# Patient Record
Sex: Female | Born: 1965 | Race: Asian | Hispanic: No | Marital: Married | Smoking: Never smoker
Health system: Southern US, Community
[De-identification: ages and names within clinical notes are randomized; demographics above are authoritative.]

## PROBLEM LIST (undated history)

## (undated) HISTORY — PX: BREAST ENHANCEMENT SURGERY: SHX7

---

## 2016-10-12 ENCOUNTER — Ambulatory Visit: Payer: Self-pay | Admitting: Nurse Practitioner

## 2016-10-16 ENCOUNTER — Ambulatory Visit (INDEPENDENT_AMBULATORY_CARE_PROVIDER_SITE_OTHER): Payer: BLUE CROSS/BLUE SHIELD | Admitting: Nurse Practitioner

## 2016-10-16 ENCOUNTER — Other Ambulatory Visit (INDEPENDENT_AMBULATORY_CARE_PROVIDER_SITE_OTHER): Payer: BLUE CROSS/BLUE SHIELD

## 2016-10-16 ENCOUNTER — Encounter: Payer: Self-pay | Admitting: Nurse Practitioner

## 2016-10-16 VITALS — BP 120/90 | HR 78 | Temp 98.3°F | Ht 61.0 in | Wt 116.0 lb

## 2016-10-16 DIAGNOSIS — Z23 Encounter for immunization: Secondary | ICD-10-CM

## 2016-10-16 DIAGNOSIS — Z780 Asymptomatic menopausal state: Secondary | ICD-10-CM | POA: Diagnosis not present

## 2016-10-16 DIAGNOSIS — Z136 Encounter for screening for cardiovascular disorders: Secondary | ICD-10-CM

## 2016-10-16 DIAGNOSIS — L299 Pruritus, unspecified: Secondary | ICD-10-CM | POA: Diagnosis not present

## 2016-10-16 DIAGNOSIS — Z114 Encounter for screening for human immunodeficiency virus [HIV]: Secondary | ICD-10-CM

## 2016-10-16 DIAGNOSIS — Z0000 Encounter for general adult medical examination without abnormal findings: Secondary | ICD-10-CM | POA: Diagnosis not present

## 2016-10-16 DIAGNOSIS — Z0001 Encounter for general adult medical examination with abnormal findings: Secondary | ICD-10-CM

## 2016-10-16 DIAGNOSIS — Z13220 Encounter for screening for lipoid disorders: Secondary | ICD-10-CM

## 2016-10-16 LAB — CBC
HCT: 41.2 % (ref 36.0–46.0)
HEMOGLOBIN: 13.7 g/dL (ref 12.0–15.0)
MCHC: 33.2 g/dL (ref 30.0–36.0)
MCV: 86.4 fl (ref 78.0–100.0)
Platelets: 309 10*3/uL (ref 150.0–400.0)
RBC: 4.77 Mil/uL (ref 3.87–5.11)
RDW: 12.9 % (ref 11.5–15.5)
WBC: 6.8 10*3/uL (ref 4.0–10.5)

## 2016-10-16 LAB — COMPREHENSIVE METABOLIC PANEL
ALK PHOS: 67 U/L (ref 39–117)
ALT: 17 U/L (ref 0–35)
AST: 17 U/L (ref 0–37)
Albumin: 4.5 g/dL (ref 3.5–5.2)
BUN: 14 mg/dL (ref 6–23)
CO2: 26 mEq/L (ref 19–32)
Calcium: 9.4 mg/dL (ref 8.4–10.5)
Chloride: 105 mEq/L (ref 96–112)
Creatinine, Ser: 0.65 mg/dL (ref 0.40–1.20)
GFR: 103.16 mL/min (ref >60.00)
GLUCOSE: 101 mg/dL — AB (ref 70–99)
Potassium: 4.2 mEq/L (ref 3.5–5.1)
SODIUM: 140 meq/L (ref 135–145)
TOTAL PROTEIN: 8 g/dL (ref 6.0–8.3)
Total Bilirubin: 0.5 mg/dL (ref 0.2–1.2)

## 2016-10-16 LAB — LIPID PANEL
Cholesterol: 161 mg/dL (ref 0–200)
HDL: 36.3 mg/dL — AB (ref >39.00)
NonHDL: 125.07
Total CHOL/HDL Ratio: 4
Triglycerides: 250 mg/dL — ABNORMAL HIGH (ref 0.0–149.0)
VLDL: 50 mg/dL — ABNORMAL HIGH (ref 0.0–40.0)

## 2016-10-16 LAB — TSH: TSH: 0.84 u[IU]/mL (ref 0.35–4.50)

## 2016-10-16 LAB — LDL CHOLESTEROL, DIRECT: Direct LDL: 88 mg/dL

## 2016-10-16 NOTE — Patient Instructions (Signed)
Go to basement for blood draw. You will be called with results.  Please make appointment for bone density at front desk

## 2016-10-16 NOTE — Progress Notes (Signed)
Subjective:    Patient ID: Amy Duffy, female    DOB: [birth year], 51 y.o.   MRN: [id number]  Patient presents today for complete physical (new patient)  HPI  use of video interpreter:  Itching of hands(chronic): Onset 1year ago, intermittent, itching at night. No rash.  She want dexa scan done despite young age.  Immunizations: (TDAP, Hep C screen, Pneumovax, Influenza, zoster)  Health Maintenance  Topic Date Due  . HIV Screening  03/31/1983  . Tetanus Vaccine  03/31/1987  . Pap Smear  03/30/1989  . Flu Shot  12/19/2016   Diet:regular  Weight:  Wt Readings from Last 3 Encounters:  10/16/16 116 lb (52.6 kg)   Exercise:none.  Fall Risk: Fall Risk  10/16/2016  Falls in the past year? No   Home Safety:home with adult son, works at [employer].  Depression/Suicide: [facility] 10/16/2016  Decreased Interest 0  Down, Depressed, Hopeless 0  PHQ - 2 Score 0   Pap Smear (every 4672yrs for >21-29 without HPV, every 7645yrs for >30-71645yrs with HPV):up to date per patient, done 03/2016 while in [place].  Vision:needed.  Dental:needed.  Sexual History (birth control, marital status, STD):married not sexually active at this time  Medications and allergies reviewed with patient and updated if appropriate.  There are no active problems to display for this patient.   No current outpatient prescriptions on file prior to visit.   No current facility-administered medications on file prior to visit.     History reviewed. No pertinent past medical history.  Past Surgical History:  Procedure Laterality Date  . BREAST ENHANCEMENT SURGERY Bilateral    breast implant    Social History   Social History  . Marital status: Married    Spouse name: N/A  . Number of children: N/A  . Years of education: N/A   Social History Main Topics  . Smoking status: Never Smoker  . Smokeless tobacco: Never Used  . Alcohol use No  . Drug use: No  . Sexual activity: Not Currently    Other Topics Concern  . None   Social History Narrative  . None    Family History  Problem Relation Age of Onset  . Family history unknown: Yes        Review of Systems  Constitutional: Negative for fever, malaise/fatigue and weight loss.  HENT: Negative for congestion and sore throat.   Eyes:       Negative for visual changes  Respiratory: Negative for cough and shortness of breath.   Cardiovascular: Negative for chest pain, palpitations and leg swelling.  Gastrointestinal: Negative for blood in stool, constipation, diarrhea and heartburn.  Genitourinary: Negative for dysuria, frequency and urgency.  Musculoskeletal: Negative for falls, joint pain and myalgias.  Skin: Positive for itching. Negative for rash.  Neurological: Negative for dizziness, sensory change and headaches.  Endo/Heme/Allergies: Does not bruise/bleed easily.  Psychiatric/Behavioral: Negative for depression, substance abuse and suicidal ideas. The patient is not nervous/anxious.     Objective:   Vitals:   10/16/16 0918  BP: 120/90  Pulse: 78  Temp: 98.3 F (36.8 C)    Body mass index is 21.92 kg/m.   Physical Examination:  Physical Exam  Constitutional: She is oriented to person, place, and time and well-developed, well-nourished, and in no distress. No distress.  HENT:  Right Ear: External ear normal.  Left Ear: External ear normal.  Nose: Nose normal.  Mouth/Throat: Oropharynx is clear and moist. No oropharyngeal exudate.  Eyes: Conjunctivae  and EOM are normal. Pupils are equal, round, and reactive to light. No scleral icterus.  Neck: Normal range of motion. Neck supple. No thyromegaly present.  Cardiovascular: Normal rate, normal heart sounds and intact distal pulses.   Pulmonary/Chest: Effort normal and breath sounds normal. She exhibits no tenderness.  Declined breast exam.  Abdominal: Soft. Bowel sounds are normal. She exhibits no distension. There is no tenderness.    Genitourinary:  Genitourinary Comments: Declined  Musculoskeletal: Normal range of motion. She exhibits no edema or tenderness.  Lymphadenopathy:    She has no cervical adenopathy.  Neurological: She is alert and oriented to person, place, and time. Gait normal.  Skin: Skin is warm and dry. No rash noted. No erythema.  Psychiatric: Affect and judgment normal.    ASSESSMENT and PLAN:  Folasade was seen today for establish care.  Diagnoses and all orders for this visit:  Preventative health care -     TSH; Future -     Comprehensive metabolic panel; Future -     CBC; Future -     Lipid panel; Future -     HIV antibody (with reflex); Future -     DG Bone Density; Future  Itching  Asymptomatic postmenopausal estrogen deficiency -     DG Bone Density; Future  Encounter for screening for human immunodeficiency virus (HIV) -     HIV antibody (with reflex); Future  Encounter for lipid screening for cardiovascular disease -     Lipid panel; Future  Need for diphtheria-tetanus-pertussis (Tdap) vaccine -     Tdap vaccine greater than or equal to 7yo IM   No problem-specific Assessment & Plan notes found for this encounter.     Follow up: Return if symptoms worsen or fail to improve.  Alysia Penna, NP

## 2016-10-17 LAB — HIV ANTIBODY (ROUTINE TESTING W REFLEX): HIV 1&2 Ab, 4th Generation: NONREACTIVE

## 2016-12-04 ENCOUNTER — Inpatient Hospital Stay: Admission: RE | Admit: 2016-12-04 | Payer: BLUE CROSS/BLUE SHIELD | Source: Ambulatory Visit

## 2017-03-21 ENCOUNTER — Encounter (HOSPITAL_COMMUNITY): Payer: Self-pay | Admitting: Emergency Medicine

## 2017-03-21 ENCOUNTER — Emergency Department (HOSPITAL_COMMUNITY): Payer: BLUE CROSS/BLUE SHIELD

## 2017-03-21 ENCOUNTER — Emergency Department (HOSPITAL_COMMUNITY)
Admission: EM | Admit: 2017-03-21 | Discharge: 2017-03-21 | Disposition: A | Discharge status: Home / Self Care | Payer: BLUE CROSS/BLUE SHIELD | Attending: Emergency Medicine | Admitting: Emergency Medicine

## 2017-03-21 DIAGNOSIS — Y92410 Unspecified street and highway as the place of occurrence of the external cause: Secondary | ICD-10-CM | POA: Diagnosis not present

## 2017-03-21 DIAGNOSIS — S299XXA Unspecified injury of thorax, initial encounter: Secondary | ICD-10-CM | POA: Diagnosis present

## 2017-03-21 DIAGNOSIS — Y999 Unspecified external cause status: Secondary | ICD-10-CM | POA: Insufficient documentation

## 2017-03-21 DIAGNOSIS — Y9389 Activity, other specified: Secondary | ICD-10-CM | POA: Insufficient documentation

## 2017-03-21 DIAGNOSIS — M25571 Pain in right ankle and joints of right foot: Secondary | ICD-10-CM | POA: Insufficient documentation

## 2017-03-21 DIAGNOSIS — S2241XA Multiple fractures of ribs, right side, initial encounter for closed fracture: Secondary | ICD-10-CM | POA: Diagnosis not present

## 2017-03-21 LAB — I-STAT CHEM 8, ED
BUN: 20 mg/dL (ref 6–20)
CREATININE: 0.4 mg/dL — AB (ref 0.44–1.00)
Calcium, Ion: 1.16 mmol/L (ref 1.15–1.40)
Chloride: 104 mmol/L (ref 101–111)
GLUCOSE: 111 mg/dL — AB (ref 65–99)
HCT: 40 % (ref 36.0–46.0)
HEMOGLOBIN: 13.6 g/dL (ref 12.0–15.0)
POTASSIUM: 4.1 mmol/L (ref 3.5–5.1)
Sodium: 140 mmol/L (ref 135–145)
TCO2: 27 mmol/L (ref 22–32)

## 2017-03-21 MED ORDER — IBUPROFEN 800 MG PO TABS: 800.0000 mg | ORAL_TABLET | Freq: Three times a day (TID) | ORAL | 0 refills | Status: AC

## 2017-03-21 MED ORDER — METHOCARBAMOL 500 MG PO TABS: 500.0000 mg | ORAL_TABLET | Freq: Every evening | ORAL | 0 refills | Status: AC | PRN

## 2017-03-21 MED ORDER — OXYCODONE-ACETAMINOPHEN 5-325 MG PO TABS: 2.0000 | ORAL_TABLET | ORAL | 0 refills | Status: DC | PRN

## 2017-03-21 MED ORDER — OXYCODONE-ACETAMINOPHEN 5-325 MG PO TABS
1.0000 | ORAL_TABLET | Freq: Once | ORAL | Status: AC
  Administered 2017-03-21: 1 via ORAL
  Filled 2017-03-21: qty 1

## 2017-03-21 MED ORDER — IOPAMIDOL (ISOVUE-300) INJECTION 61%
INTRAVENOUS | Status: AC
  Administered 2017-03-21: 75 mL
  Filled 2017-03-21: qty 75

## 2017-03-21 NOTE — ED Notes (Signed)
Pt transported to and from CT/radiology via wheelchair with tech; son accompanied pt to interpret.

## 2017-03-21 NOTE — ED Notes (Signed)
Pt triaged by using [employer]

## 2017-03-21 NOTE — ED Notes (Signed)
ED Provider at bedside. 
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
## 2017-03-21 NOTE — Progress Notes (Signed)
Orthopedic Tech Progress Note Patient Details:  Dyke MaesLan Melody 1965-06-14 [id number] Patient refused aso splint. Patient ID: Dyke MaesLan Glander, female   DOB: 1965-06-14, 51 y.o.   MRN: [id number]   Jennye MoccasinHughes, Kimmy Parish Craig 03/21/2017, 8:25 PM
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
## 2022-12-06 ENCOUNTER — Emergency Department (HOSPITAL_COMMUNITY): Payer: BLUE CROSS/BLUE SHIELD

## 2022-12-06 ENCOUNTER — Encounter (HOSPITAL_COMMUNITY): Payer: Self-pay

## 2022-12-06 ENCOUNTER — Emergency Department (HOSPITAL_COMMUNITY)
Admission: EM | Admit: 2022-12-06 | Discharge: 2022-12-06 | Disposition: A | Discharge status: Home / Self Care | Payer: BLUE CROSS/BLUE SHIELD | Attending: Emergency Medicine | Admitting: Emergency Medicine

## 2022-12-06 ENCOUNTER — Other Ambulatory Visit: Payer: Self-pay

## 2022-12-06 DIAGNOSIS — N201 Calculus of ureter: Secondary | ICD-10-CM | POA: Insufficient documentation

## 2022-12-06 DIAGNOSIS — R109 Unspecified abdominal pain: Secondary | ICD-10-CM | POA: Diagnosis present

## 2022-12-06 LAB — BASIC METABOLIC PANEL
Anion gap: 8 (ref 5–15)
BUN: 16 mg/dL (ref 6–20)
CO2: 24 mmol/L (ref 22–32)
Calcium: 9.1 mg/dL (ref 8.9–10.3)
Chloride: 105 mmol/L (ref 98–111)
Creatinine, Ser: 0.66 mg/dL (ref 0.44–1.00)
GFR, Estimated: >60 mL/min (ref >60)
Glucose, Bld: 131 mg/dL — ABNORMAL HIGH (ref 70–99)
Potassium: 4 mmol/L (ref 3.5–5.1)
Sodium: 137 mmol/L (ref 135–145)

## 2022-12-06 LAB — URINALYSIS, ROUTINE W REFLEX MICROSCOPIC
Bilirubin Urine: NEGATIVE
Glucose, UA: NEGATIVE mg/dL
Ketones, ur: NEGATIVE mg/dL
Leukocytes,Ua: NEGATIVE
Nitrite: NEGATIVE
Protein, ur: NEGATIVE mg/dL
RBC / HPF: >50 RBC/hpf (ref 0–5)
Specific Gravity, Urine: 1.016 (ref 1.005–1.030)
pH: 6 (ref 5.0–8.0)

## 2022-12-06 LAB — CBC
HCT: 41.4 % (ref 36.0–46.0)
Hemoglobin: 13.2 g/dL (ref 12.0–15.0)
MCH: 27.8 pg (ref 26.0–34.0)
MCHC: 31.9 g/dL (ref 30.0–36.0)
MCV: 87.2 fL (ref 80.0–100.0)
Platelets: 320 10*3/uL (ref 150–400)
RBC: 4.75 MIL/uL (ref 3.87–5.11)
RDW: 13.1 % (ref 11.5–15.5)
WBC: 8.4 10*3/uL (ref 4.0–10.5)
nRBC: 0 % (ref 0.0–0.2)

## 2022-12-06 LAB — LIPASE, BLOOD: Lipase: 41 U/L (ref 11–51)

## 2022-12-06 LAB — HEPATIC FUNCTION PANEL
ALT: 21 U/L (ref 0–44)
AST: 21 U/L (ref 15–41)
Albumin: 4 g/dL (ref 3.5–5.0)
Alkaline Phosphatase: 71 U/L (ref 38–126)
Bilirubin, Direct: <0.1 mg/dL (ref 0.0–0.2)
Total Bilirubin: 0.8 mg/dL (ref 0.3–1.2)
Total Protein: 7.5 g/dL (ref 6.5–8.1)

## 2022-12-06 MED ORDER — TAMSULOSIN HCL 0.4 MG PO CAPS: 0.4000 mg | ORAL_CAPSULE | Freq: Every day | ORAL | 0 refills | Status: AC

## 2022-12-06 MED ORDER — ACETAMINOPHEN 500 MG PO TABS
1000.0000 mg | ORAL_TABLET | Freq: Once | ORAL | Status: AC
  Administered 2022-12-06: 1000 mg via ORAL
  Filled 2022-12-06: qty 2

## 2022-12-06 MED ORDER — DIPHENHYDRAMINE HCL 25 MG PO CAPS
25.0000 mg | ORAL_CAPSULE | Freq: Once | ORAL | Status: DC
  Filled 2022-12-06: qty 1

## 2022-12-06 MED ORDER — ONDANSETRON 4 MG PO TBDP
4.0000 mg | ORAL_TABLET | Freq: Once | ORAL | Status: AC
  Administered 2022-12-06: 4 mg via ORAL
  Filled 2022-12-06: qty 1

## 2022-12-06 MED ORDER — OXYCODONE HCL 5 MG PO TABS
5.0000 mg | ORAL_TABLET | Freq: Once | ORAL | Status: AC
  Administered 2022-12-06: 5 mg via ORAL
  Filled 2022-12-06: qty 1

## 2022-12-06 MED ORDER — IOHEXOL 350 MG/ML SOLN
75.0000 mL | Freq: Once | INTRAVENOUS | Status: AC | PRN
  Administered 2022-12-06: 75 mL via INTRAVENOUS

## 2022-12-06 MED ORDER — OXYCODONE-ACETAMINOPHEN 5-325 MG PO TABS: 1.0000 | ORAL_TABLET | Freq: Four times a day (QID) | ORAL | 0 refills | Status: AC | PRN

## 2022-12-06 NOTE — Discharge Instructions (Addendum)
You were seen today with a kidney stone. Please return with any new or worsening symptoms.

## 2022-12-06 NOTE — ED Triage Notes (Signed)
Pt c/o left flank pain and vomiting started today. Pt denies urinary issues.

## 2022-12-06 NOTE — ED Notes (Signed)
Pt gone to CT 

## 2022-12-06 NOTE — ED Provider Notes (Signed)
Emergency Department Provider Note   I have reviewed the triage vital signs and the nursing notes.   HISTORY  Chief Complaint Flank Pain  [place] interpreter used for this encounter.  HPI Amy Duffy is a 57 y.o. female with past history reviewed below presents to the emergency department with acute onset left flank pain and vomiting.  Symptoms began yesterday.  Pain is intermittent and severe.  She denies any gross hematuria, dysuria, hesitancy, urgency.  No fevers or chills.  No chest pain or shortness of breath.  No similar pain in the past.   History reviewed. No pertinent past medical history.  Review of Systems  Constitutional: No fever/chills Eyes: No visual changes. ENT: No sore throat. Cardiovascular: Denies chest pain. Respiratory: Denies shortness of breath. Gastrointestinal: Positive left flank/abdominal pain. Positive nausea and vomiting. Genitourinary: Negative for dysuria. Musculoskeletal: Negative for back pain. Skin: Negative for rash. Neurological: Negative for headaches.   ____________________________________________   PHYSICAL EXAM:  VITAL SIGNS: ED Triage Vitals  Encounter Vitals Group     BP 12/06/22 1454 (!) 147/105     Pulse Rate 12/06/22 1454 76     Resp 12/06/22 1454 20     Temp 12/06/22 1454 98.3 F (36.8 C)     Temp Source 12/06/22 1454 Oral     SpO2 12/06/22 1454 97 %     Weight 12/06/22 1501 108 lb 0.4 oz (49 kg)     Height 12/06/22 1501 5\' 1"  (1.549 m)   Constitutional: Alert and oriented. Well appearing and in no acute distress. Eyes: Conjunctivae are normal.  Head: Atraumatic. Nose: No congestion/rhinnorhea. Mouth/Throat: Mucous membranes are moist.   Neck: No stridor.   Cardiovascular: Normal rate, regular rhythm. Good peripheral circulation. Grossly normal heart sounds.   Respiratory: Normal respiratory effort.  No retractions. Lungs CTAB. Gastrointestinal: Soft and nontender. No distention.  Musculoskeletal: No lower  extremity tenderness nor edema. No gross deformities of extremities. Neurologic:  Normal speech and language. No gross focal neurologic deficits are appreciated.  Skin:  Skin is warm, dry and intact. No rash noted.  ____________________________________________   LABS (all labs ordered are listed, but only abnormal results are displayed)  Labs Reviewed  BASIC METABOLIC PANEL - Abnormal; Notable for the following components:      Result Value   Glucose, Bld 131 (*)    All other components within normal limits  URINALYSIS, ROUTINE W REFLEX MICROSCOPIC - Abnormal; Notable for the following components:   Hgb urine dipstick LARGE (*)    Bacteria, UA RARE (*)    All other components within normal limits  CBC  HEPATIC FUNCTION PANEL  LIPASE, BLOOD   ____________________________________________  RADIOLOGY  CT ABDOMEN PELVIS W CONTRAST  Result Date: 12/06/2022 CLINICAL DATA:  Acute nonlocalized abdominal pain.  Left flank pain. EXAM: CT ABDOMEN AND PELVIS WITH CONTRAST TECHNIQUE: Multidetector CT imaging of the abdomen and pelvis was performed using the standard protocol following bolus administration of intravenous contrast. RADIATION DOSE REDUCTION: This exam was performed according to the departmental dose-optimization program which includes automated exposure control, adjustment of the mA and/or kV according to patient size and/or use of iterative reconstruction technique. CONTRAST:  75mL OMNIPAQUE IOHEXOL 350 MG/ML SOLN COMPARISON:  None Available. FINDINGS: Lower chest: Lung bases are clear.  Bilateral breast implants. Hepatobiliary: Diffuse fatty infiltration of the liver. No focal lesions. Gallbladder and bile ducts are normal. Pancreas: Unremarkable. No pancreatic ductal dilatation or surrounding inflammatory changes. Spleen: Normal in size without focal abnormality. Adrenals/Urinary  Tract: No adrenal gland nodules. 3 mm stone in the left ureteropelvic junction with mild hydronephrosis and  stranding around the left kidney. Nephrograms are symmetrical. Bladder is normal. Stomach/Bowel: Stomach is within normal limits. Appendix appears normal. No evidence of bowel wall thickening, distention, or inflammatory changes. Vascular/Lymphatic: No significant vascular findings are present. No enlarged abdominal or pelvic lymph nodes. Reproductive: Uterus and bilateral adnexa are unremarkable. Other: No abdominal wall hernia or abnormality. No abdominopelvic ascites. 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 IMPRESSION: 1. 3 mm stone in the left kidney at the ureteropelvic junction with mild proximal obstruction. 2. No evidence of bowel obstruction or inflammation. 3. Diffuse fatty infiltration of the liver. Electronically Signed   By: Burman Nieves M.D.   On: 12/06/2022 21:08    ____________________________________________   PROCEDURES  Procedure(s) performed:   Procedures  None  ____________________________________________   INITIAL IMPRESSION / ASSESSMENT AND PLAN / ED COURSE  Pertinent labs & imaging results that were available during my care of the patient were reviewed by me and considered in my medical decision making (see chart for details).   This patient is Presenting for Evaluation of abdominal pain, which does require a range of treatment options, and is a complaint that involves a high risk of morbidity and mortality.  The Differential Diagnoses  includes but is not exclusive to acute cholecystitis, intrathoracic causes for epigastric abdominal pain, gastritis, duodenitis, pancreatitis, small bowel or large bowel obstruction, abdominal aortic aneurysm, hernia, gastritis, etc.   Critical Interventions-    Medications  acetaminophen (TYLENOL) tablet 1,000 mg (1,000 mg Oral Given 12/06/22 1523)  oxyCODONE (Oxy IR/ROXICODONE) immediate release tablet 5 mg (5 mg Oral Given 12/06/22 1524)  ondansetron (ZOFRAN-ODT) disintegrating tablet 4 mg (4 mg  Oral Given 12/06/22 1524)  iohexol (OMNIPAQUE) 350 MG/ML injection 75 mL (75 mLs Intravenous Contrast Given 12/06/22 2055)    Reassessment after intervention: pain resolved.    I did obtain Additional Historical Information from family at bedside.    Clinical Laboratory Tests Ordered, included UA without infection. No AKI. No anemia.   Radiologic Tests Ordered, included CT abdomen/pelvis. I independently interpreted the images and agree with radiology interpretation.   Cardiac Monitor Tracing which shows NSR.    Social Determinants of Health Risk patient is a non-smoker.   Medical Decision Making: Summary:  Patient presents emergency department for evaluation of flank pain.  CT imaging shows evidence of small ureteral stone with hydro-.  No evidence of infection.  Patient's pain symptoms are well-controlled on reassessment.  Considered admission but with no evidence of developing urosepsis and pain well-controlled patient is stable for discharge with pain management symptoms and urology follow-up at home.  Discussed this with the family at bedside using the [facility] to be [facility] interpreter.  Patient's presentation is most consistent with acute presentation with potential threat to life or bodily function.   Disposition: discharge  ____________________________________________  FINAL CLINICAL IMPRESSION(S) / ED DIAGNOSES  Final diagnoses:  Left ureteral stone     NEW OUTPATIENT MEDICATIONS STARTED DURING THIS VISIT:  Discharge Medication List as of 12/06/2022 10:46 PM     START taking these medications   Details  tamsulosin (FLOMAX) 0.4 MG CAPS capsule Take 1 capsule (0.4 mg total) by mouth daily., Starting Thu 12/06/2022, Normal        Note:  This document was prepared using Dragon voice recognition software and may include unintentional dictation errors.  Alona Bene, MD, [facility]    Izsak Meir, Ivin Booty  Reece Agar, MD 12/10/22 [phone]
